# Patient Record
Sex: Male | Born: 1965 | Hispanic: No | Smoking: Never smoker
Health system: Southern US, Community
[De-identification: ages and names within clinical notes are randomized; demographics above are authoritative.]

## PROBLEM LIST (undated history)

## (undated) HISTORY — PX: APPENDECTOMY: SHX54

---

## 2019-08-16 ENCOUNTER — Emergency Department (HOSPITAL_COMMUNITY)
Admission: EM | Admit: 2019-08-16 | Discharge: 2019-08-17 | Disposition: A | Discharge status: Home / Self Care | Payer: Self-pay | Attending: Emergency Medicine | Admitting: Emergency Medicine

## 2019-08-16 ENCOUNTER — Emergency Department (HOSPITAL_COMMUNITY): Payer: Self-pay

## 2019-08-16 ENCOUNTER — Encounter (HOSPITAL_COMMUNITY): Payer: Self-pay | Admitting: Emergency Medicine

## 2019-08-16 ENCOUNTER — Other Ambulatory Visit: Payer: Self-pay

## 2019-08-16 DIAGNOSIS — R1032 Left lower quadrant pain: Secondary | ICD-10-CM | POA: Insufficient documentation

## 2019-08-16 DIAGNOSIS — N50812 Left testicular pain: Secondary | ICD-10-CM | POA: Insufficient documentation

## 2019-08-16 DIAGNOSIS — K5730 Diverticulosis of large intestine without perforation or abscess without bleeding: Secondary | ICD-10-CM | POA: Insufficient documentation

## 2019-08-16 DIAGNOSIS — I10 Essential (primary) hypertension: Secondary | ICD-10-CM | POA: Insufficient documentation

## 2019-08-16 LAB — URINALYSIS, ROUTINE W REFLEX MICROSCOPIC
Bilirubin Urine: NEGATIVE
Glucose, UA: NEGATIVE mg/dL
Hgb urine dipstick: NEGATIVE
Ketones, ur: NEGATIVE mg/dL
Leukocytes,Ua: NEGATIVE
Nitrite: NEGATIVE
Protein, ur: NEGATIVE mg/dL
Specific Gravity, Urine: 1.004 — ABNORMAL LOW (ref 1.005–1.030)
pH: 7 (ref 5.0–8.0)

## 2019-08-16 LAB — CBC
HCT: 44.2 % (ref 39.0–52.0)
Hemoglobin: 15 g/dL (ref 13.0–17.0)
MCH: 31.8 pg (ref 26.0–34.0)
MCHC: 33.9 g/dL (ref 30.0–36.0)
MCV: 93.8 fL (ref 80.0–100.0)
Platelets: 223 10*3/uL (ref 150–400)
RBC: 4.71 MIL/uL (ref 4.22–5.81)
RDW: 12.5 % (ref 11.5–15.5)
WBC: 9.8 10*3/uL (ref 4.0–10.5)
nRBC: 0 % (ref 0.0–0.2)

## 2019-08-16 LAB — COMPREHENSIVE METABOLIC PANEL
ALT: 105 U/L — ABNORMAL HIGH (ref 0–44)
AST: 99 U/L — ABNORMAL HIGH (ref 15–41)
Albumin: 4.3 g/dL (ref 3.5–5.0)
Alkaline Phosphatase: 90 U/L (ref 38–126)
Anion gap: 8 (ref 5–15)
BUN: 12 mg/dL (ref 6–20)
CO2: 25 mmol/L (ref 22–32)
Calcium: 9.6 mg/dL (ref 8.9–10.3)
Chloride: 102 mmol/L (ref 98–111)
Creatinine, Ser: 0.9 mg/dL (ref 0.61–1.24)
GFR calc Af Amer: >60 mL/min (ref >60)
GFR calc non Af Amer: >60 mL/min (ref >60)
Glucose, Bld: 108 mg/dL — ABNORMAL HIGH (ref 70–99)
Potassium: 3.9 mmol/L (ref 3.5–5.1)
Sodium: 135 mmol/L (ref 135–145)
Total Bilirubin: 0.7 mg/dL (ref 0.3–1.2)
Total Protein: 8.2 g/dL — ABNORMAL HIGH (ref 6.5–8.1)

## 2019-08-16 LAB — LIPASE, BLOOD: Lipase: 51 U/L (ref 11–51)

## 2019-08-16 MED ORDER — SODIUM CHLORIDE 0.9% FLUSH: 3.0000 mL | Freq: Once | INTRAVENOUS | Status: DC

## 2019-08-16 MED ORDER — KETOROLAC TROMETHAMINE 30 MG/ML IJ SOLN
30.0000 mg | Freq: Once | INTRAMUSCULAR | Status: AC
  Administered 2019-08-17: 30 mg via INTRAVENOUS
  Filled 2019-08-16: qty 1

## 2019-08-16 NOTE — ED Triage Notes (Addendum)
Pt. Came from home with friend. Pt. Complains of pain to lower left quadrant of abdomen that intermittently radiates to his groin and back to his lower left kidney area. Pt. Has had occasational nausea but has not vomited.  Pt. Took Amlodipine 5mg , Tamsulosin 0.4 mg and tramadol 50 mg before coming to hospital.  Pt. Also complains of numbness and tingling in both hands along with ocassional dizziness.

## 2019-08-16 NOTE — ED Provider Notes (Addendum)
TIME SEEN: 11:13 PM  CHIEF COMPLAINT: Left lower quadrant abdominal pain, left sided testicular pain  HPI: Patient is a 53 year old male with no significant past medical history who presents to the emergency department with left lower quadrant pain that radiates into the left testicle for the past 2 weeks.  Was seen at urgent care 2 days ago and was told that he had normal-appearing urinalysis.  Instructed to come here if pain did not improve.  No previous history of kidney stones.  No dysuria, hematuria, penile discharge.  Has had previous appendectomy.  Denies fevers, nausea, vomiting, diarrhea.  Patient and family also concerned that he has had elevated blood pressure.  Started on amlodipine at urgent care.  No headache, vision changes, chest pain, shortness of breath.  Has intermittently had numbness in the left hand only that is worse at night and better after moving the arm around.  He still feels like his fingers are "thick".  No other numbness or focal weakness.  No facial droop or speech changes.  He does not have a primary care physician.  ROS: See HPI Constitutional: no fever  Eyes: no drainage  ENT: no runny nose   Cardiovascular:  no chest pain  Resp: no SOB  GI: no vomiting GU: no dysuria Integumentary: no rash  Allergy: no hives  Musculoskeletal: no leg swelling  Neurological: no slurred speech ROS otherwise negative  PAST MEDICAL HISTORY/PAST SURGICAL HISTORY:  History reviewed. No pertinent past medical history.  MEDICATIONS:  Prior to Admission medications   Not on File    ALLERGIES:  Not on File  SOCIAL HISTORY:  Social History   Tobacco Use  . Smoking status: Never Smoker  . Smokeless tobacco: Never Used  Substance Use Topics  . Alcohol use: Yes    Alcohol/week: 6.0 standard drinks    Types: 6 Cans of beer per week    FAMILY HISTORY: No family history on file.  EXAM: BP (!) 154/85   Pulse (!) 59   Temp 98.3 F (36.8 C) (Oral)   Resp 11   Ht 5'  6" (1.676 m)   Wt 70.3 kg   SpO2 100%   BMI 25.02 kg/m  CONSTITUTIONAL: Alert and oriented and responds appropriately to questions. Well-appearing; well-nourished HEAD: Normocephalic EYES: Conjunctivae clear, pupils appear equal, EOM appear intact ENT: normal nose; moist mucous membranes NECK: Supple, normal ROM CARD: RRR; S1 and S2 appreciated; no murmurs, no clicks, no rubs, no gallops RESP: Normal chest excursion without splinting or tachypnea; breath sounds clear and equal bilaterally; no wheezes, no rhonchi, no rales, no hypoxia or respiratory distress, speaking full sentences ABD/GI: Normal bowel sounds; non-distended; soft, tender in the left lower quadrant with mild voluntary guarding, no rebound tenderness GU:  Normal external genitalia, normal penile shaft, no blood or discharge at the urethral meatus, patient has tenderness throughout the left testicle without any masses appreciated, no scrotal redness, warmth or swelling, no hernias appreciated, 2+ femoral pulses bilaterally; no perineal erythema, warmth, subcutaneous air or crepitus; no high riding testicle, normal bilateral cremasteric reflex.  Chaperone present for exam. BACK:  The back appears normal EXT: Normal ROM in all joints; no deformity noted, no edema; no cyanosis SKIN: Normal color for age and race; warm; no rash on exposed skin NEURO: Moves all extremities equally PSYCH: The patient's mood and manner are appropriate.   MEDICAL DECISION MAKING: Patient here with left-sided abdominal pain and left testicular pain.  Differential includes kidney stone, epididymitis, testicular torsion, diverticulitis,  colitis.  Labs obtained in triage and urinalysis appear unremarkable other than mildly elevated AST and ALT.  He has no right upper quadrant tenderness on exam.  Will give Toradol for pain and obtain CT renal study as well as an ultrasound of the testicles.  ED PROGRESS: Patient's CT scan shows diverticulosis without  diverticulitis.  No kidney stone, hydronephrosis.  Gallbladder appears normal.  Question bladder wall thickening due to mild cystitis versus under distention.  Urine here appears completely normal.  Will send urine culture.  No other acute abnormality noted.  Patient's testicular ultrasound also normal with no sign of torsion, mass, epididymitis.  Patient denies concern for STD but will send gonorrhea and chlamydia test today.  Have recommended follow-up with a gastroenterologist given this left lower quadrant pain that he has had for 2 weeks.  He was prescribed Flomax and tramadol from urgent care.  Recommended that he discontinue Flomax and may continue tramadol as needed.  Will provide with prescription of ibuprofen given Toradol gave good pain relief here today and ibuprofen at home has been helping.  Will also provide with primary care provider given his new diagnosis of hypertension.  Blood pressures in the 140s to 150s/90s here and he is asymptomatic.  Discussed return precautions with patient and family at bedside.  They are comfortable with this plan.  Will discharge home.   At this time, I do not feel there is any life-threatening condition present. I have reviewed, interpreted and discussed all results (EKG, imaging, lab, urine as appropriate) and exam findings with patient/family. I have reviewed nursing notes and appropriate previous records.  I feel the patient is safe to be discharged home without further emergent workup and can continue workup as an outpatient as needed. Discussed usual and customary return precautions. Patient/family verbalize understanding and are comfortable with this plan.  Outpatient follow-up has been provided as needed. All questions have been answered.   Jordan Bullock was evaluated in Emergency Department on 08/16/2019 for the symptoms described in the history of present illness. He was evaluated in the context of the global COVID-19 pandemic, which necessitated  consideration that the patient might be at risk for infection with the SARS-CoV-2 virus that causes COVID-19. Institutional protocols and algorithms that pertain to the evaluation of patients at risk for COVID-19 are in a state of rapid change based on information released by regulatory bodies including the CDC and federal and state organizations. These policies and algorithms were followed during the patient's care in the ED.  Patient was seen wearing N95, face shield, gloves.     Christpher Stogsdill, Delice Bison, DO 08/17/19 0031    Zilphia Kozinski, Delice Bison, DO 08/17/19 9417

## 2019-08-17 MED ORDER — IBUPROFEN 800 MG PO TABS: 800.0000 mg | ORAL_TABLET | Freq: Three times a day (TID) | ORAL | 0 refills | Status: AC | PRN

## 2019-08-17 NOTE — Discharge Instructions (Addendum)
Your CT scan, ultrasound showed no acute abnormality today to explain your pain.  Your CT did show diverticulosis without signs of inflammation.  Diverticulosis does not normally cause pain. I recommend follow-up with a gastroenterologist as you may need a colonoscopy if pain continues.  You may take ibuprofen as needed for pain.  Please take this medication with food.  You may also continue the tramadol that you were prescribed by urgent care but this is a narcotic pain medication and can make you drowsy so you should not drink alcohol, drive a car when taking this medication.  Tramadol may also make you constipated.  You may use over-the-counter MiraLAX 1-2 times daily, Colace 100 mg twice daily as needed to help with constipation.  Some of your liver function tests were mildly elevated.  I recommend that you avoid Tylenol and alcohol at this time.  This can be followed by your primary care doctor.  Please follow-up with your primary care physician as well for continued monitoring and management of your high blood pressure.   Steps to find a Primary Care Provider (PCP):  Call [phone] or [phone] to access "[facility]."  2.  You may also go on the [facility] website at [email or web address]  3.  [facility] also frequently accepts new patients.  [place] [phone]  4.  There are also multiple [facility], [facility] and [facility] practices throughout the [place] that are frequently accepting new patients. You may find a clinic that is close to your home and contact them.  [facility] [email or web address] [phone]  [facility] [email or web address]  [email or web address] [place] [email or web address] [phone]  5.  Local Health Departments also can provide primary care  services.  [facility]  [place] [phone]  [facility] [place] [address] [place] [facility] [place] [phone]

## 2019-08-17 NOTE — ED Notes (Signed)
Pt given Sprite for fluid challenge. Pt tolerating well. Will continue to monitor.  

## 2019-08-18 LAB — URINE CULTURE: Culture: NO GROWTH

## 2020-10-17 IMAGING — CT CT RENAL STONE PROTOCOL
2 of 4 series · 16 of 46 positions shown, 18 images · non-contrast
Comparison: None.

CLINICAL DATA: Left lower quadrant pain radiating to the groin

EXAM:
CT ABDOMEN AND PELVIS WITHOUT CONTRAST
TECHNIQUE: Multidetector CT imaging of the abdomen and pelvis was performed
following the standard protocol without IV contrast.

[Series 3: ap without · axial · non-contrast · 0.75mm/px · z∈[-420,-15]mm · 13 of 91 slices shown, 15 images]
[im 5/91  soft-tissue]
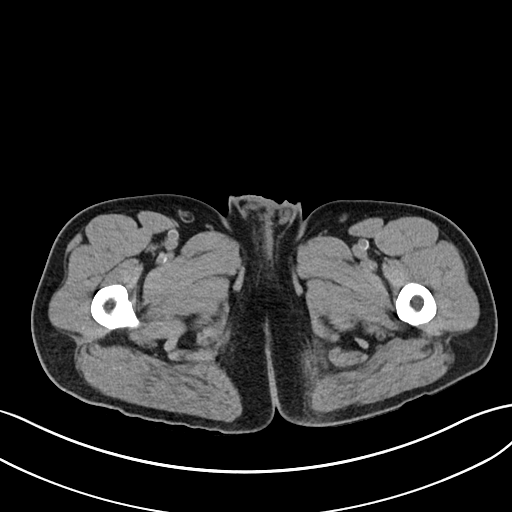
[im 5/91  bone]
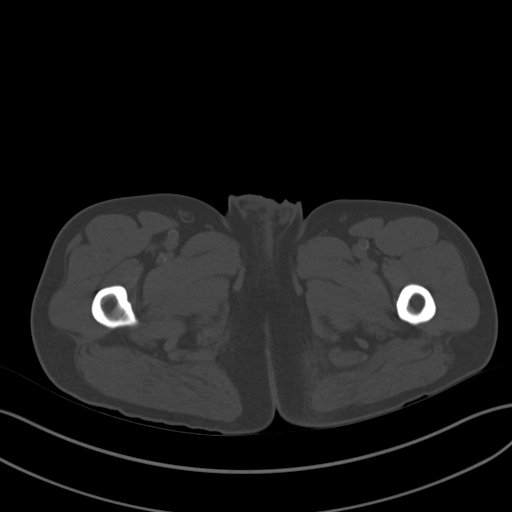
[im 15/91  soft-tissue]
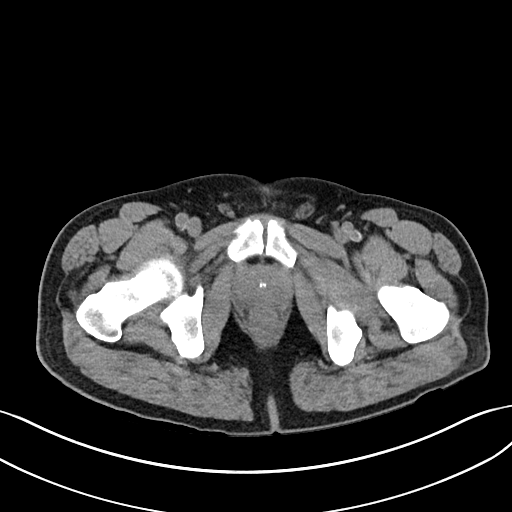
[im 19/91  soft-tissue]
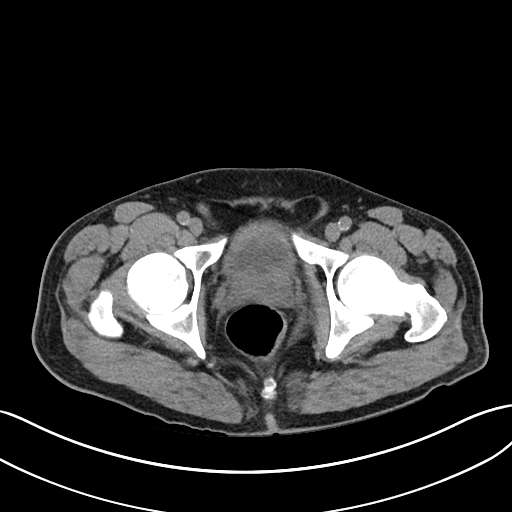
[im 24/91  soft-tissue]
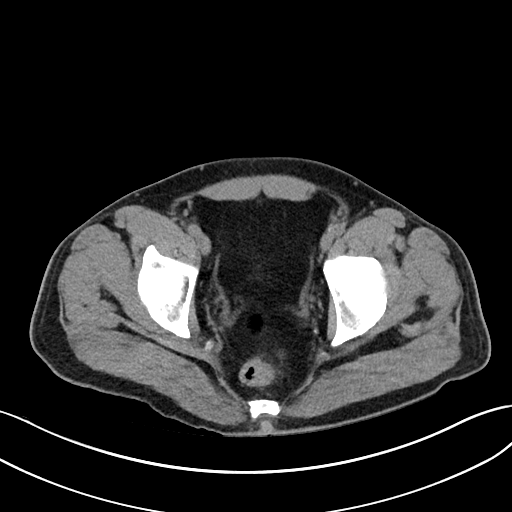
[im 34/91  soft-tissue]
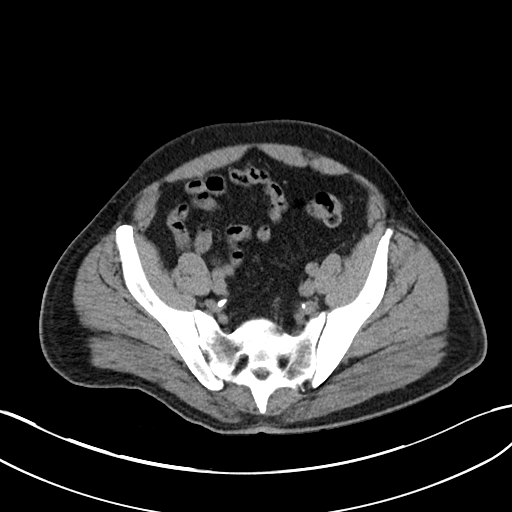
[im 38/91  soft-tissue]
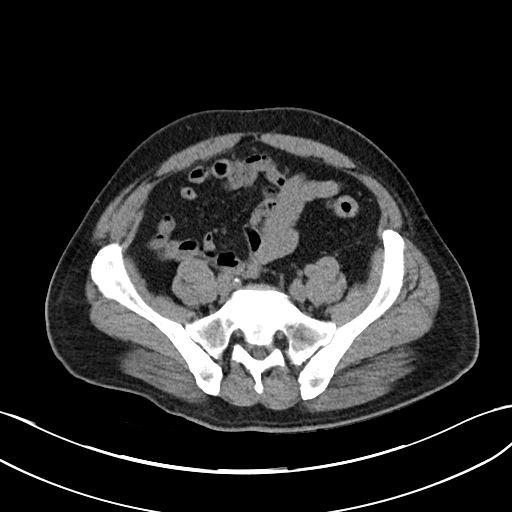
[im 48/91  soft-tissue]
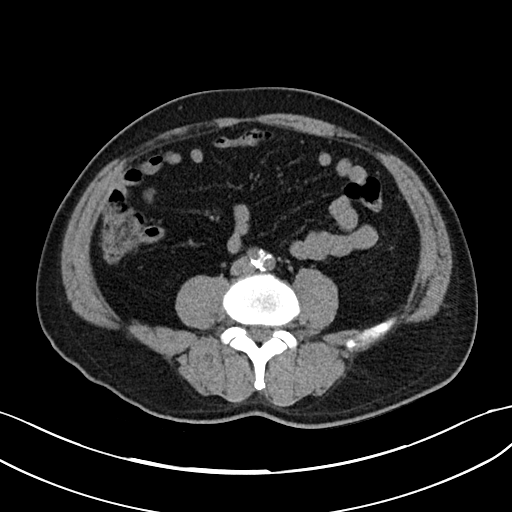
[im 53/91  soft-tissue]
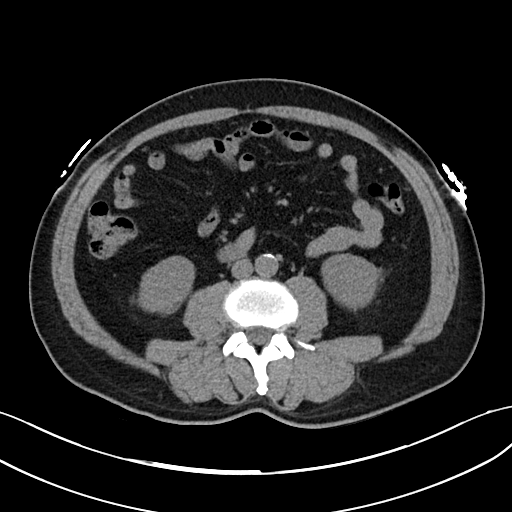
[im 57/91  soft-tissue]
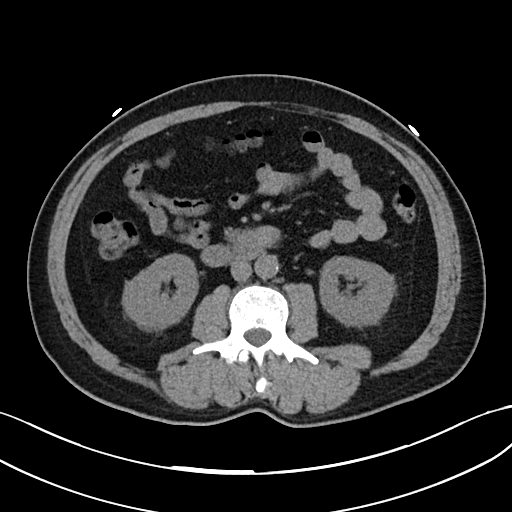
[im 57/91  bone]
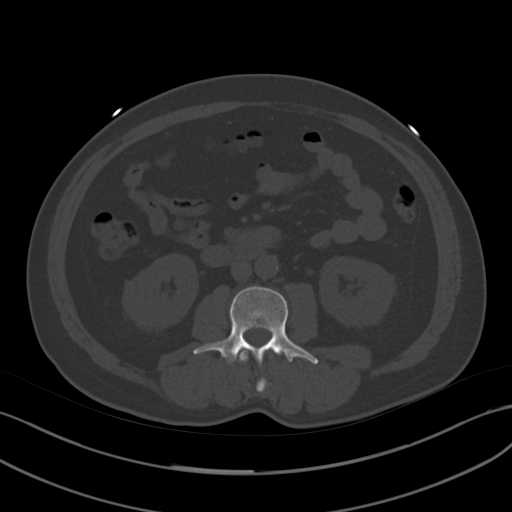
[im 67/91  soft-tissue]
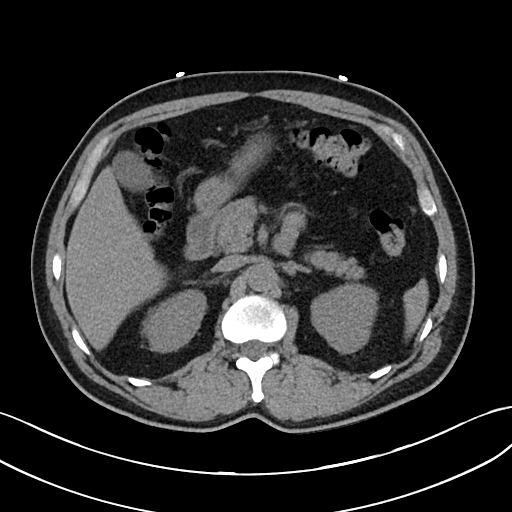
[im 72/91  soft-tissue]
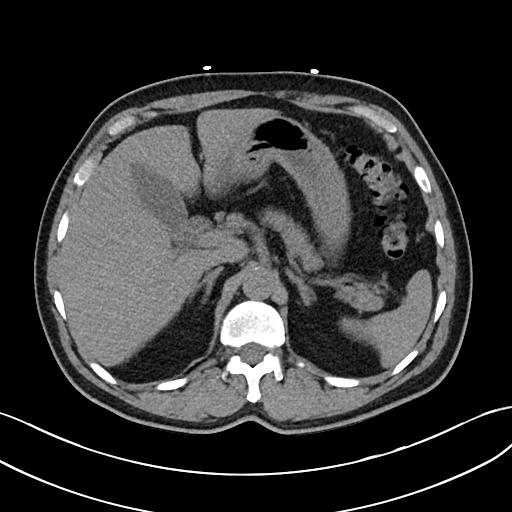
[im 76/91  soft-tissue]
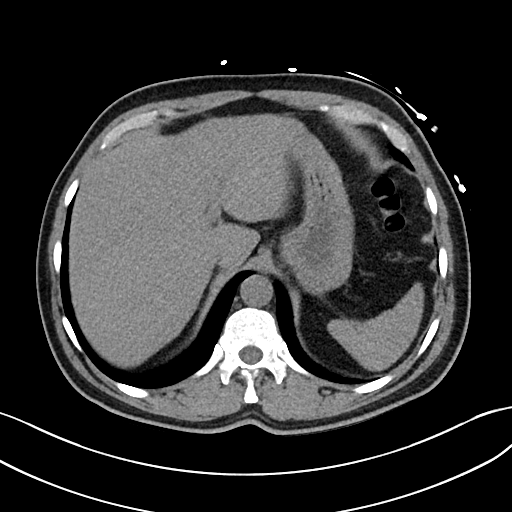
[im 86/91  soft-tissue]
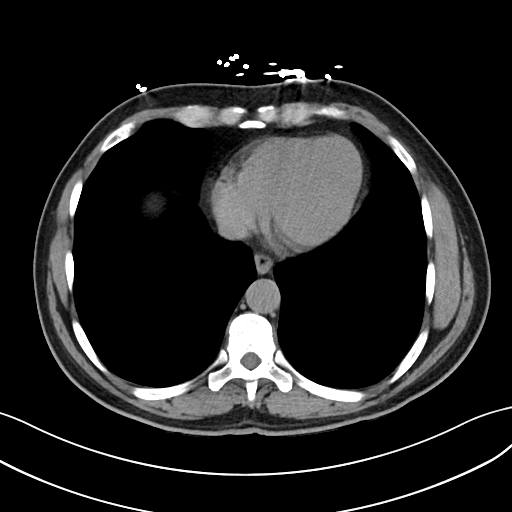

[Series 6: cor · coronal · 0.72mm/px · 3 of 95 slices shown]
[im 32/95  soft-tissue]
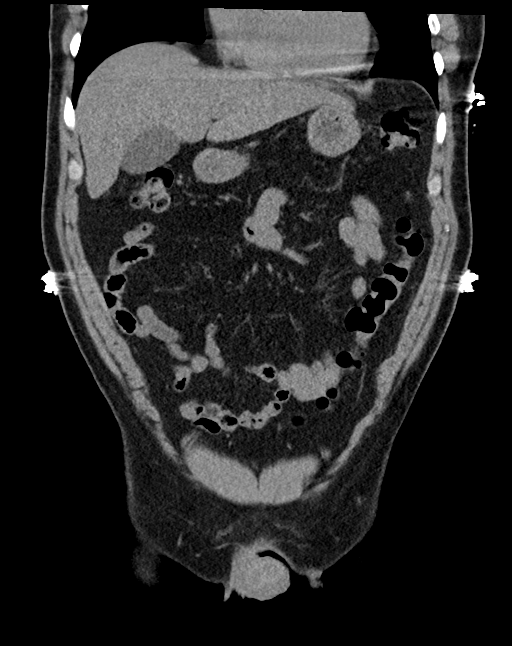
[im 42/95  soft-tissue]
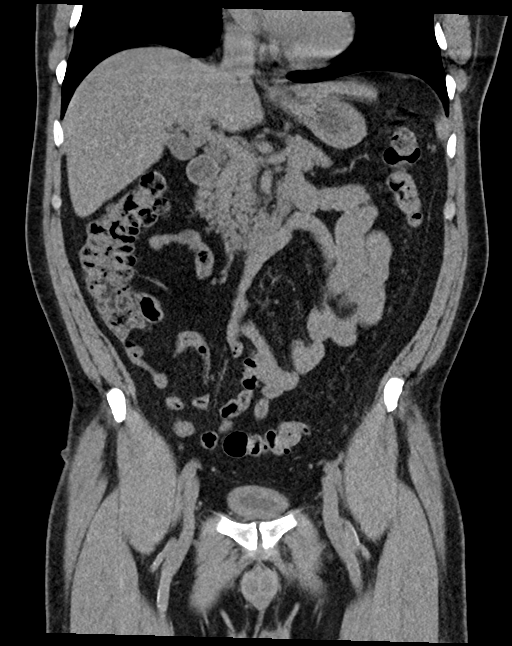
[im 53/95  soft-tissue]
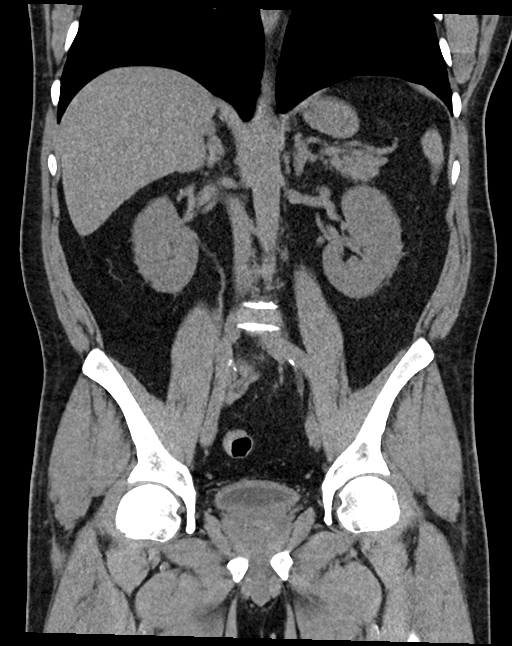

[16 of 46 positions shown; findings below may reference images not displayed]

FINDINGS: Lower chest: Lung bases demonstrate no acute consolidation or
effusion. Heart size within normal limits

Hepatobiliary: No focal liver abnormality is seen. No gallstones,
gallbladder wall thickening, or biliary dilatation.

Pancreas: Unremarkable. No pancreatic ductal dilatation or
surrounding inflammatory changes.

Spleen: Normal in size without focal abnormality.

Adrenals/Urinary Tract: Adrenal glands are unremarkable. Kidneys are
normal, without renal calculi, focal lesion, or hydronephrosis.
Bladder is slightly thick walled

Stomach/Bowel: Stomach is within normal limits. Status post
appendectomy. No evidence of bowel wall thickening, distention, or
inflammatory changes. Sigmoid colon diverticula without acute
inflammatory change

Vascular/Lymphatic: Mild aortic atherosclerosis. No aneurysm. No
significant adenopathy

Reproductive: Slightly enlarged prostate with calcification

Other: Negative for free air or free fluid

Musculoskeletal: No acute or significant osseous findings.
IMPRESSION: 1. Negative for hydronephrosis or ureteral stone. Slightly
thick-walled urinary bladder which may be secondary to under
distension versus mild cystitis
2. Sigmoid colon diverticular disease without acute inflammatory
change

## 2021-07-11 ENCOUNTER — Inpatient Hospital Stay
Admit: 2021-07-11 | Discharge: 2021-07-11 | Disposition: A | Discharge status: Home / Self Care | Payer: PRIVATE HEALTH INSURANCE | Attending: Emergency Medicine

## 2021-07-11 DIAGNOSIS — S81011A Laceration without foreign body, right knee, initial encounter: Secondary | ICD-10-CM

## 2021-07-11 MED ORDER — AMOXICILLIN 875 MG TAB
875 mg | ORAL_TABLET | Freq: Two times a day (BID) | ORAL | 0 refills | Status: AC
Start: 2021-07-11 — End: 2021-07-21

## 2021-07-11 MED ORDER — DIPHTH,PERTUS(AC)TETANUS VAC(PF) 2.5 LF UNIT-8 MCG-5 LF/0.5 ML INJ
INTRAMUSCULAR | Status: AC
Start: 2021-07-11 — End: 2021-07-11
  Administered 2021-07-11: 19:00:00 via INTRAMUSCULAR

## 2021-07-11 MED FILL — BOOSTRIX TDAP 2.5 LF UNIT-8 MCG-5 LF/0.5 ML INTRAMUSCULAR SYRINGE: INTRAMUSCULAR | Qty: 0.5

## 2021-07-11 NOTE — ED Notes (Signed)
Patient discharge instructions given to patient with E-script for Amoxicillin as prescribed. Wound care as instructed. Return for worsening symptoms and suture removal in 14 days. Patient verbalized understanding discharged home via private car. Work note given.

## 2021-07-11 NOTE — ED Notes (Signed)
Pt. Lacerated Left knee with chain saw approx. 20 min. Ago.

## 2021-07-11 NOTE — ED Provider Notes (Signed)
ED Provider Notes by Rexene Agent, FNP at 07/11/21 1433                Author: Rexene Agent, FNP  Service: Emergency Medicine  Author Type: Nurse Practitioner       Filed: 07/11/21 1439  Date of Service: 07/11/21 1433  Status: Attested Addendum          Editor: Rexene Agent, FNP (Nurse Practitioner)       Related Notes: Original Note by Rexene Agent, FNP (Nurse Practitioner) filed at 07/11/21  1438          Cosigner: Ambrose Pancoast, MD at 07/12/21 0521            Procedure Orders        1. Wound Repair [[id number]] ordered by Rexene Agent, FNP                         Attestation signed by Ambrose Pancoast, MD at 07/12/21 [phone]          I was personally available for consultation in the emergency department.  I have reviewed the chart and agree with the documentation recorded by the midlevel  provider, including the assessment, treatment plan, and disposition.      Ambrose Pancoast, MD                                 Patient presents for evaluation of a laceration to the left knee. Injury occurred 1 hour ago. The mechanism of the  wound was a chainsaw. The patient reports pain at the site of injury. There were no other injuries.  Patient denies head injury, loss of consciousness, neck pain, and chest pain. The tetanus status is more than 10 years ago       The history is provided by the patient.    Laceration    The incident occurred less than 1 hour ago. The laceration is located on the Left foot. The laceration is 4 cm in size. The injury mechanism is other. Foreign body present: no. The  pain is at a severity of 6/10. The pain is mild. The pain has been Constant since onset. Pertinent negatives include no numbness, no tingling,  no weakness, no loss of motion, no coolness and no discoloration. The  patient's last tetanus shot was more than 10 years ago.       History reviewed. No pertinent past medical history.        Past Surgical History:         Procedure  Laterality  Date           ?  HX GI              ?  HX HERNIA REPAIR                 History reviewed. No pertinent family history.        Social History          Socioeconomic History         ?  Marital status:  MARRIED              Spouse name:  Not on file         ?  Number of children:  Not on file     ?  Years of education:  Not on file     ?  Highest education level:  Not on file       Occupational History        ?  Not on file       Tobacco Use         ?  Smoking status:  Never     ?  Smokeless tobacco:  Never       Vaping Use         ?  Vaping Use:  Never used       Substance and Sexual Activity         ?  Alcohol use:  Not Currently     ?  Drug use:  Not Currently     ?  Sexual activity:  Not on file        Other Topics  Concern        ?  Not on file       Social History Narrative        ?  Not on file          Social Determinants of Health          Financial Resource Strain: Not on file     Food Insecurity: Not on file     Transportation Needs: Not on file     Physical Activity: Not on file     Stress: Not on file     Social Connections: Not on file     Intimate Partner Violence: Not on file       Housing Stability: Not on file              ALLERGIES: Patient has no known allergies.      Review of Systems    Neurological:  Negative for tingling, weakness and numbness.         Vitals:          07/11/21 1343        BP:  (!) 148/100     Pulse:  94     Resp:  18     Temp:  97.4 ??F (36.3 ??C)     SpO2:  96%     Weight:  103 kg (227 lb)        Height:  5\' 10"  (1.778 m)                Physical Exam   Vitals and nursing note reviewed.    Constitutional:        Appearance: He is not ill-appearing.    HENT:       Head: Normocephalic and atraumatic.       Right Ear: External ear normal.       Left Ear: External ear normal.       Nose: Nose normal.       Mouth/Throat:       Mouth: Mucous membranes are moist.       Pharynx: Oropharynx is clear.    Eyes:       Extraocular Movements: Extraocular movements intact.       Pupils: Pupils are equal,  round, and reactive to light.     Cardiovascular:       Rate and Rhythm: Normal rate and regular rhythm.    Pulmonary:       Effort: Pulmonary effort is normal. No respiratory distress.       Breath sounds: Normal breath sounds.     Abdominal:       Palpations: There is no mass.  Tenderness: There is no abdominal tenderness. There is no guarding or rebound.     Musculoskeletal:          General: No swelling or tenderness. Normal range of motion.       Cervical back: Normal range of motion. No rigidity. No muscular tenderness.    Skin:      General: Skin is warm and dry.       Capillary Refill: Capillary refill takes less than 2 seconds.       Findings: Laceration present.                  Comments: Laceration     Neurological:       General: No focal deficit present.       Mental Status: He is alert and oriented to person, place, and time.    Psychiatric:          Mood and Affect: Mood normal.          Behavior: Behavior normal.           MDM   Number of Diagnoses or Management Options   Diagnosis management comments: Based on my initial history, physical examination, this patient's past history and risk factors, we will obtain diagnostics and based on these and response to therapy  will make a decision with best judgement to admit, transfer or discharge this patient with close follow up.  My initial plan is likely to clean wound and closed with sutures.    Give tetanus vaccine.             Amount and/or Complexity of Data Reviewed   Discussion of test results  with the performing providers: yes   Decide to obtain previous medical records or to obtain history from someone other than the patient: yes   Obtain history from someone other than the patient: yes   Review and summarize past medical records: yes      Risk of Complications, Morbidity, and/or Mortality   Presenting problems: high   Diagnostic procedures: high   Management options: high      Patient Progress   Patient progress: stable             Wound  Repair      Date/Time: 07/11/2021 2:39 PM   Performed by: NPPreparation: skin prepped with Betadine   Location details: right knee   Wound length:2.6 - 7.5 cm   Anesthesia: local infiltration      Anesthesia:   Local Anesthetic: lidocaine 1% with epinephrine   Foreign bodies: no foreign bodies   Irrigation solution: saline   Irrigation method: syringe   Debridement: minimal   Skin closure: 4-0 nylon   Number of sutures: 6   Approximation: close   Dressing: 4x4 and antibiotic ointment   My total time at bedside, performing this procedure was 1-15 minutes.

## 2021-07-11 NOTE — ED Notes (Signed)
DSD applied with instruction.

## 2021-07-11 NOTE — ED Notes (Signed)
Laceration repair being done by NP.

## 2021-07-11 NOTE — ED Notes (Signed)
Patient medicated with T-dap as ordered see MAR for time.
